# Patient Record
Sex: Female | Born: 1970 | Race: Black or African American | Hispanic: No | Marital: Single | Smoking: Never smoker
Health system: Southern US, Community
[De-identification: ages and names within clinical notes are randomized; demographics above are authoritative.]

## PROBLEM LIST (undated history)

## (undated) DIAGNOSIS — I10 Essential (primary) hypertension: Secondary | ICD-10-CM

## (undated) HISTORY — PX: ABDOMINAL HYSTERECTOMY: SHX81

## (undated) HISTORY — PX: REPLACEMENT TOTAL KNEE: SUR1224

---

## 2010-07-09 ENCOUNTER — Ambulatory Visit: Payer: Self-pay | Admitting: Obstetrics and Gynecology

## 2010-07-15 ENCOUNTER — Ambulatory Visit: Payer: Self-pay | Admitting: Obstetrics and Gynecology

## 2010-07-19 ENCOUNTER — Emergency Department: Payer: Self-pay | Admitting: Emergency Medicine

## 2011-06-15 ENCOUNTER — Emergency Department: Payer: Self-pay | Admitting: Emergency Medicine

## 2011-06-20 ENCOUNTER — Ambulatory Visit: Payer: Self-pay | Admitting: Physician Assistant

## 2012-06-10 ENCOUNTER — Emergency Department: Payer: Self-pay | Admitting: Emergency Medicine

## 2013-04-24 ENCOUNTER — Emergency Department: Payer: Self-pay | Admitting: Emergency Medicine

## 2014-04-24 ENCOUNTER — Emergency Department
Admit: 2014-04-24 | Disposition: A | Discharge status: Home / Self Care | Payer: Self-pay | Admitting: Emergency Medicine

## 2014-04-24 LAB — CBC WITH DIFFERENTIAL/PLATELET
BASOS PCT: 0.5 %
Basophil #: 0.1 10*3/uL (ref 0.0–0.1)
EOS ABS: 0.1 10*3/uL (ref 0.0–0.7)
Eosinophil %: 1.1 %
HCT: 42.2 % (ref 35.0–47.0)
HGB: 13.4 g/dL (ref 12.0–16.0)
Lymphocyte #: 1.2 10*3/uL (ref 1.0–3.6)
Lymphocyte %: 12.3 %
MCH: 27.1 pg (ref 26.0–34.0)
MCHC: 31.8 g/dL — ABNORMAL LOW (ref 32.0–36.0)
MCV: 85 fL (ref 80–100)
MONO ABS: 0.9 x10 3/mm (ref 0.2–0.9)
Monocyte %: 8.7 %
NEUTROS ABS: 7.8 10*3/uL — AB (ref 1.4–6.5)
Neutrophil %: 77.4 %
Platelet: 168 10*3/uL (ref 150–440)
RBC: 4.96 10*6/uL (ref 3.80–5.20)
RDW: 14.5 % (ref 11.5–14.5)
WBC: 10.1 10*3/uL (ref 3.6–11.0)

## 2014-04-25 LAB — BASIC METABOLIC PANEL
Anion Gap: 10 (ref 7–16)
BUN: 12 mg/dL
Calcium, Total: 8.8 mg/dL — ABNORMAL LOW
Chloride: 104 mmol/L
Co2: 25 mmol/L
Creatinine: 0.88 mg/dL
EGFR (Non-African Amer.): >60
GLUCOSE: 106 mg/dL — AB
POTASSIUM: 3.2 mmol/L — AB
Sodium: 139 mmol/L

## 2014-11-29 ENCOUNTER — Emergency Department: Payer: BLUE CROSS/BLUE SHIELD

## 2014-11-29 ENCOUNTER — Emergency Department
Admission: EM | Admit: 2014-11-29 | Discharge: 2014-11-29 | Disposition: A | Discharge status: Home / Self Care | Payer: BLUE CROSS/BLUE SHIELD | Attending: Emergency Medicine | Admitting: Emergency Medicine

## 2014-11-29 DIAGNOSIS — R059 Cough, unspecified: Secondary | ICD-10-CM

## 2014-11-29 DIAGNOSIS — J029 Acute pharyngitis, unspecified: Secondary | ICD-10-CM | POA: Insufficient documentation

## 2014-11-29 DIAGNOSIS — R05 Cough: Secondary | ICD-10-CM | POA: Diagnosis present

## 2014-11-29 DIAGNOSIS — J159 Unspecified bacterial pneumonia: Secondary | ICD-10-CM | POA: Insufficient documentation

## 2014-11-29 DIAGNOSIS — J189 Pneumonia, unspecified organism: Secondary | ICD-10-CM

## 2014-11-29 MED ORDER — BENZONATATE 100 MG PO CAPS: 100.0000 mg | ORAL_CAPSULE | Freq: Three times a day (TID) | ORAL | Status: DC | PRN

## 2014-11-29 MED ORDER — ALBUTEROL SULFATE HFA 108 (90 BASE) MCG/ACT IN AERS: 2.0000 | INHALATION_SPRAY | Freq: Four times a day (QID) | RESPIRATORY_TRACT | Status: DC | PRN

## 2014-11-29 MED ORDER — IPRATROPIUM-ALBUTEROL 0.5-2.5 (3) MG/3ML IN SOLN
3.0000 mL | Freq: Once | RESPIRATORY_TRACT | Status: AC
  Administered 2014-11-29: 3 mL via RESPIRATORY_TRACT
  Filled 2014-11-29: qty 3

## 2014-11-29 MED ORDER — AZITHROMYCIN 250 MG PO TABS: ORAL_TABLET | ORAL | Status: AC

## 2014-11-29 NOTE — ED Notes (Signed)
Assessment per PA 

## 2014-11-29 NOTE — ED Provider Notes (Signed)
[facility] Emergency Department Provider Note ____________________________________________  Time seen: 1805  I have reviewed the triage vital signs and the nursing notes.  HISTORY  Chief Complaint  Cough  HPI Stacey Bentley is a 44 y.o. female reports to the ED for evaluation of intermittent productive cough for the last 3 weeks. She reports as well or stiffness to her voice and mild sore throat. She describes cough is worse at night. She has had a few episodes where she has been coughing up sputum, so the point of vomiting. She denies any outright chest pain, shortness of breath, or fevers/chills/sweats. She's been dosing over-the-counter medicine without significant relief, but has recently discontinued them due to concerns for elevated blood pressure.  History reviewed. No pertinent past medical history.  There are no active problems to display for this patient.  History reviewed. No pertinent past surgical history.  Current Outpatient Rx  Name  Route  Sig  Dispense  Refill  . albuterol (PROVENTIL HFA;VENTOLIN HFA) 108 (90 BASE) MCG/ACT inhaler   Inhalation   Inhale 2 puffs into the lungs every 6 (six) hours as needed for wheezing or shortness of breath.   1 Inhaler   0   . azithromycin (ZITHROMAX Z-PAK) 250 MG tablet      Take 2 tablets (500 mg) on  Day 1,  followed by 1 tablet (250 mg) once daily on Days 2 through 5.   6 each   0   . benzonatate (TESSALON PERLES) 100 MG capsule   Oral   Take 1 capsule (100 mg total) by mouth 3 (three) times daily as needed for cough (Take 1-2 per dose).   30 capsule   0     Allergies Review of patient's allergies indicates no known allergies.  History reviewed. No pertinent family history.  Social History Social History  Substance Use Topics  . Smoking status: Never Smoker   . Smokeless tobacco: None  . Alcohol Use: No   Review of Systems  Constitutional: Negative for fever. Eyes: Negative for  visual changes. ENT: Negative for sore throat. Cardiovascular: Negative for chest pain. Respiratory: Negative for shortness of breath. Productive cough as above. Gastrointestinal: Negative for abdominal pain, vomiting and diarrhea. Genitourinary: Negative for dysuria. Musculoskeletal: Negative for back pain. Skin: Negative for rash. Neurological: Negative for headaches, focal weakness or numbness. ____________________________________________  PHYSICAL EXAM:  VITAL SIGNS: ED Triage Vitals  Enc Vitals Group     BP 11/29/14 1747 142/81 mmHg     Pulse Rate 11/29/14 1747 61     Resp 11/29/14 1747 18     Temp 11/29/14 1747 98.2 F (36.8 C)     Temp Source 11/29/14 1747 Oral     SpO2 11/29/14 1747 100 %     Weight 11/29/14 1747 260 lb (117.935 kg)     Height 11/29/14 1747  (1.803 m)     Head Cir --      Peak Flow --      Pain Score --      Pain Loc --      Pain Edu? --      Excl. in GC? --    Constitutional: Alert and oriented. Well appearing and in no distress. Head: Normocephalic and atraumatic.      Eyes: Conjunctivae are normal. PERRL. Normal extraocular movements      Ears: Canals clear. TMs intact bilaterally.   Nose: No congestion/rhinorrhea.   Mouth/Throat: Mucous membranes are moist.   Neck: Supple.  No thyromegaly. Hematological/Lymphatic/Immunological: No cervical lymphadenopathy. Cardiovascular: Normal rate, regular rhythm.  Respiratory: Normal respiratory effort. No wheezes/rales/rhonchi. Gastrointestinal: Soft and nontender. No distention. Musculoskeletal: Nontender with normal range of motion in all extremities.  Neurologic:  Normal gait without ataxia. Normal speech and language. No gross focal neurologic deficits are appreciated. Skin:  Skin is warm, dry and intact. No rash noted. Psychiatric: Mood and affect are normal. Patient exhibits appropriate insight and judgment. ____________________________________________    RADIOLOGY CXR IMPRESSION: Mild patchy opacity along the medial right lung base, atelectasis versus pneumonia.  I, Trinidi Toppins, Charlesetta IvoryJenise V Bacon, personally viewed and evaluated these images (plain radiographs) as part of my medical decision making.  ____________________________________________  PROCEDURES  Duoneb x 1 ____________________________________________  INITIAL IMPRESSION / ASSESSMENT AND PLAN / ED COURSE  Patient with radiologic findings suspicious for a right middle lobe pneumonia versus atelectasis. Given patient's ongoing symptoms and productive cough, we will treat empirically for presumed community acquired pneumonia. She will be provided with a prescription for Z-Pak, albuterol, and Tessalon Perles to dose as directed. She is encouraged to increase her fluid intake to reduce dehydration and promote productive cough. She is follow with a primary care provider or return to the ED for acutely worsening symptoms. ____________________________________________  FINAL CLINICAL IMPRESSION(S) / ED DIAGNOSES  Final diagnoses:  Community acquired pneumonia  Cough      Lissa HoardJenise V Bacon Masey Scheiber, PA-C 11/29/14 1858  Sharyn CreamerMark Quale, MD 11/29/14 [phone]

## 2014-11-29 NOTE — Discharge Instructions (Signed)
Community-Acquired Pneumonia, Adult Pneumonia is an infection of the lungs. One type of pneumonia can happen while a person is in a hospital. A different type can happen when a person is not in a hospital (community-acquired pneumonia). It is easy for this kind to spread from person to person. It can spread to you if you breathe near an infected person who coughs or sneezes. Some symptoms include:  A dry cough.  A wet (productive) cough.  Fever.  Sweating.  Chest pain. HOME CARE  Take over-the-counter and prescription medicines only as told by your doctor.  Only take cough medicine if you are losing sleep.  If you were prescribed an antibiotic medicine, take it as told by your doctor. Do not stop taking the antibiotic even if you start to feel better.  Sleep with your head and neck raised (elevated). You can do this by putting a few pillows under your head, or you can sleep in a recliner.  Do not use tobacco products. These include cigarettes, chewing tobacco, and e-cigarettes. If you need help quitting, ask your doctor.  Drink enough water to keep your pee (urine) clear or pale yellow. A shot (vaccine) can help prevent pneumonia. Shots are often suggested for:  People older than 10265 years of age.  People older than 46 years of age:  Who are having cancer treatment.  Who have long-term (chronic) lung disease.  Who have problems with their body's defense system (immune system). You may also prevent pneumonia if you take these actions:  Get the flu (influenza) shot every year.  Go to the dentist as often as told.  Wash your hands often. If soap and water are not available, use hand sanitizer. GET HELP IF:  You have a fever.  You lose sleep because your cough medicine does not help. GET HELP RIGHT AWAY IF:  You are short of breath and it gets worse.  You have more chest pain.  Your sickness gets worse. This is very serious if:  You are an older adult.  Your  body's defense system is weak.  You cough up blood.   This information is not intended to replace advice given to you by your health care provider. Make sure you discuss any questions you have with your health care provider.   Document Released: 06/25/2007 Document Revised: 09/27/2014 Document Reviewed: 05/03/2014 Elsevier Interactive Patient Education 2016 [employer]  Cough, Adult A cough helps to clear your throat and lungs. A cough may last only 2-3 weeks (acute), or it may last longer than 8 weeks (chronic). Many different things can cause a cough. A cough may be a sign of an illness or another medical condition. HOME CARE  Pay attention to any changes in your cough.  Take medicines only as told by your doctor.  If you were prescribed an antibiotic medicine, take it as told by your doctor. Do not stop taking it even if you start to feel better.  Talk with your doctor before you try using a cough medicine.  Drink enough fluid to keep your pee (urine) clear or pale yellow.  If the air is dry, use a cold steam vaporizer or humidifier in your home.  Stay away from things that make you cough at work or at home.  If your cough is worse at night, try using extra pillows to raise your head up higher while you sleep.  Do not smoke, and try not to be around smoke. If you need help quitting, ask  your doctor.  Do not have caffeine.  Do not drink alcohol.  Rest as needed. GET HELP IF:  You have new problems (symptoms).  You cough up yellow fluid (pus).  Your cough does not get better after 2-3 weeks, or your cough gets worse.  Medicine does not help your cough and you are not sleeping well.  You have pain that gets worse or pain that is not helped with medicine.  You have a fever.  You are losing weight and you do not know why.  You have night sweats. GET HELP RIGHT AWAY IF:  You cough up blood.  You have trouble breathing.  Your heartbeat is very fast.   This  information is not intended to replace advice given to you by your health care provider. Make sure you discuss any questions you have with your health care provider.   Document Released: 09/19/2010 Document Revised: 09/27/2014 Document Reviewed: 03/15/2014 Elsevier Interactive Patient Education [employer]  Take the antibiotic as directed until completely gone. Follow-up with [facility] as needed for ongoing symptoms. Return as needed for worsening symptoms.

## 2014-11-29 NOTE — ED Notes (Signed)
Patient with cough for 3 weeks.

## 2015-03-10 ENCOUNTER — Emergency Department: Payer: BLUE CROSS/BLUE SHIELD

## 2015-03-10 ENCOUNTER — Encounter: Payer: Self-pay | Admitting: Emergency Medicine

## 2015-03-10 ENCOUNTER — Emergency Department
Admission: EM | Admit: 2015-03-10 | Discharge: 2015-03-10 | Disposition: A | Discharge status: Home / Self Care | Payer: BLUE CROSS/BLUE SHIELD | Attending: Emergency Medicine | Admitting: Emergency Medicine

## 2015-03-10 DIAGNOSIS — R0601 Orthopnea: Secondary | ICD-10-CM | POA: Insufficient documentation

## 2015-03-10 DIAGNOSIS — M1711 Unilateral primary osteoarthritis, right knee: Secondary | ICD-10-CM | POA: Diagnosis not present

## 2015-03-10 DIAGNOSIS — R030 Elevated blood-pressure reading, without diagnosis of hypertension: Secondary | ICD-10-CM | POA: Diagnosis not present

## 2015-03-10 DIAGNOSIS — R05 Cough: Secondary | ICD-10-CM | POA: Insufficient documentation

## 2015-03-10 DIAGNOSIS — I517 Cardiomegaly: Secondary | ICD-10-CM | POA: Diagnosis not present

## 2015-03-10 DIAGNOSIS — M25561 Pain in right knee: Secondary | ICD-10-CM | POA: Diagnosis present

## 2015-03-10 LAB — BASIC METABOLIC PANEL
Anion gap: 5 (ref 5–15)
BUN: 11 mg/dL (ref 6–20)
CALCIUM: 9 mg/dL (ref 8.9–10.3)
CO2: 30 mmol/L (ref 22–32)
CREATININE: 0.74 mg/dL (ref 0.44–1.00)
Chloride: 104 mmol/L (ref 101–111)
GFR calc Af Amer: >60 mL/min (ref >60)
GLUCOSE: 91 mg/dL (ref 65–99)
Potassium: 3.7 mmol/L (ref 3.5–5.1)
Sodium: 139 mmol/L (ref 135–145)

## 2015-03-10 LAB — TROPONIN I

## 2015-03-10 LAB — BRAIN NATRIURETIC PEPTIDE: B NATRIURETIC PEPTIDE 5: 59 pg/mL (ref 0.0–100.0)

## 2015-03-10 NOTE — ED Notes (Signed)
Reports cough and sob when lying flat x 3 weeks.  No resp distress.  Also reports right knee pain, states she twisted it.

## 2015-03-10 NOTE — ED Provider Notes (Signed)
[facility] Emergency Department Provider Note  ____________________________________________  Time seen: Approximately 4:19 PM  I have reviewed the triage vital signs and the nursing notes.   HISTORY  Chief Complaint Cough and Knee Pain    HPI Stacey Bentley is a 45 y.o. female , NAD, reports the emergency department with cough and shortness of breath when lying flat for 3 weeks. They said began while she was in the shower and felt chest tightness and fatigue. He states she lost balance and braced herself with her right leg injury her right knee. Denies any fever, chills, body aches. Has not had any chest pain since that time. Denies shortness of breath or wheeze other than when she lies flat. Not had any symptoms of paroxysmal nocturnal dyspnea. Denies any personal past medical history. Denies any familial history of cardiac or pulmonary issues. Does not have a primary care provider. Does report that she had surgery to her right knee approximately 3-4 months ago.   History reviewed. No pertinent past medical history.  There are no active problems to display for this patient.   History reviewed. No pertinent past surgical history.  Current Outpatient Rx  Name  Route  Sig  Dispense  Refill  . albuterol (PROVENTIL HFA;VENTOLIN HFA) 108 (90 BASE) MCG/ACT inhaler   Inhalation   Inhale 2 puffs into the lungs every 6 (six) hours as needed for wheezing or shortness of breath.   1 Inhaler   0   . azithromycin (ZITHROMAX Z-PAK) 250 MG tablet      Take 2 tablets (500 mg) on  Day 1,  followed by 1 tablet (250 mg) once daily on Days 2 through 5.   6 each   0   . benzonatate (TESSALON PERLES) 100 MG capsule   Oral   Take 1 capsule (100 mg total) by mouth 3 (three) times daily as needed for cough (Take 1-2 per dose).   30 capsule   0     Allergies Review of patient's allergies indicates no known allergies.  No family history on file.  Social  History Social History  Substance Use Topics  . Smoking status: Never Smoker   . Smokeless tobacco: None  . Alcohol Use: No     Review of Systems  Constitutional: No fever/chills or fatigue Eyes: No visual changes.  ENT: No sore throat, ear pain, nasal congestion. Cardiovascular: Positive chest pain. Respiratory: Positive cough, shortness of breath. No wheezing.  Gastrointestinal: No abdominal pain.  No nausea, vomiting.   Musculoskeletal: Positive right knee pain. Negative for back pain.  Skin: Negative for rash. Neurological: Negative for headaches, focal weakness or numbness. 10-point ROS otherwise negative.  ____________________________________________   PHYSICAL EXAM:  VITAL SIGNS: ED Triage Vitals  Enc Vitals Group     BP 03/10/15 1518 125/100 mmHg     Pulse Rate 03/10/15 1518 67     Resp 03/10/15 1518 18     Temp 03/10/15 1518 98.6 F (37 C)     Temp Source 03/10/15 1518 Oral     SpO2 03/10/15 1518 98 %     Weight 03/10/15 1518 276 lb (125.193 kg)     Height 03/10/15 1518  (1.803 m)     Head Cir --      Peak Flow --      Pain Score --      Pain Loc --      Pain Edu? --      Excl. in GC? --  Constitutional: Alert and oriented. Well appearing and in no acute distress. Obese Eyes: Conjunctivae are normal. PERRL. EOMI without pain.  Head: Atraumatic. Neck: No stridor. Supple with full range of motion. Hematological/Lymphatic/Immunilogical: No cervical lymphadenopathy. Cardiovascular: Normal rate, regular rhythm. Grossly normal S1 and S2.  No murmurs, rubs, or gallops. Good peripheral circulation. Respiratory: Normal respiratory effort without tachypnea or retractions. Lungs CTAB. Musculoskeletal: No tenderness to palpation about the right knee. No effusion. Full range of motion of the right knee. No edema.  No laxity of the right knee with anterior posterior drawer. No medial or lateral tenderness to palpation. Neurologic:  Normal speech and language.  No gross focal neurologic deficits are appreciated.  Skin:  Skin is warm, dry and intact. No rash noted. Psychiatric: Mood and affect are normal. Speech and behavior are normal. Patient exhibits appropriate insight and judgement.   ____________________________________________   LABS (all labs ordered are listed, but only abnormal results are displayed)  Labs Reviewed  TROPONIN I  BRAIN NATRIURETIC PEPTIDE  BASIC METABOLIC PANEL   ____________________________________________  EKG  EKG showed sinus rhythm and no acute changes. EKG also reviewed and discussed with Dr. Jene Every. ____________________________________________  RADIOLOGY I have personally viewed and evaluated these images (plain radiographs) as part of my medical decision making, as well as reviewing the written report by the radiologist.  Dg Chest 2 View  03/10/2015  CLINICAL DATA:  Shortness of breath for 3 weeks.  Initial encounter. EXAM: CHEST  2 VIEW COMPARISON:  PA and lateral chest 11/29/2014 and 04/24/2013. FINDINGS: The lungs are clear. Heart size is mildly enlarged. No pneumothorax or pleural effusion. No focal bony abnormality. IMPRESSION: Mild cardiomegaly without acute disease. Electronically Signed   By: Drusilla Kanner M.D.   On: 03/10/2015 16:40   Dg Knee Complete 4 Views Right  03/10/2015  CLINICAL DATA:  Right knee pain for 3-4 weeks. No known injury. Initial encounter. History of prior right knee surgery in 2016. EXAM: RIGHT KNEE - COMPLETE 4+ VIEW COMPARISON:  None. FINDINGS: No acute bony or joint abnormality is identified. The patient has markedly advanced for age and severe degenerative disease about the knee appearing worst medially where there is bone-on-bone joint space narrowing. Bulky tricompartmental osteophytosis is seen. There is a small joint effusion. IMPRESSION: No acute abnormality. 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 Electronically Signed   By: Drusilla Kanner M.D.   On: 03/10/2015 16:56    ____________________________________________    PROCEDURES  Procedure(s) performed: None    Medications - No data to display   ____________________________________________   INITIAL IMPRESSION / ASSESSMENT AND PLAN / ED COURSE  Pertinent labs & imaging results that were available during my care of the patient were reviewed by me and considered in my medical decision making (see chart for details).  Patient has been stable without any significant symptoms throughout her ED course. Patient's presentation and care while in the ED has been discussed with Dr. Jene Every who agrees with treatment course. All pertinent labs returned without any significant abnormalities. Troponin 1 was negative. At this time the patient's diagnosis is consistent with orthopnea with cardiomegaly on chest x-ray. Symptoms have been ongoing chronically may be related to untreated hypertension. Advise that the patient follow up with Dr. Juliann Pares with [facility] cardiology. Also advised the patient establish care with [facility] clinic for primary care. Patient may take Tylenol as needed for arthritic pain in the right knee. Advise that she follow-up with her personal orthopedic  in follow-up if her pain continues. Patient is given ED precautions to return to the ED for any worsening or new symptoms.    ____________________________________________  FINAL CLINICAL IMPRESSION(S) / ED DIAGNOSES  Final diagnoses:  Orthopnea  Cardiomegaly  Elevated blood-pressure reading without diagnosis of hypertension  Arthritis of right knee      NEW MEDICATIONS STARTED DURING THIS VISIT:  New Prescriptions   No medications on file         Hope Pigeon, PA-C 03/10/15 1843  Jene Every, MD 03/10/15 2358

## 2015-03-10 NOTE — ED Provider Notes (Signed)
ED ECG REPORT I, Jene Every, the attending physician, personally viewed and interpreted this ECG.  Date: 03/10/2015 EKG Time: 5:35 PM Rate: 61 Rhythm: normal sinus rhythm QRS Axis: normal Intervals: normal ST/T Wave abnormalities: normal Conduction Disturbances: none Narrative Interpretation: unremarkable   Jene Every, MD 03/10/15 2352

## 2015-03-10 NOTE — ED Notes (Signed)
NAD noted at time of D/C. Pt denies questions or concerns. Pt ambulatory to the lobby at this time.  

## 2015-03-10 NOTE — Discharge Instructions (Signed)
Arthritis Arthritis is a term that is commonly used to refer to joint pain or joint disease. There are more than 100 types of arthritis. CAUSES The most common cause of this condition is wear and tear of a joint. Other causes include:  Gout.  Inflammation of a joint.  An infection of a joint.  Sprains and other injuries near the joint.  A drug reaction or allergic reaction. In some cases, the cause may not be known. SYMPTOMS The main symptom of this condition is pain in the joint with movement. Other symptoms include:  Redness, swelling, or stiffness at a joint.  Warmth coming from the joint.  Fever.  Overall feeling of illness. DIAGNOSIS This condition may be diagnosed with a physical exam and tests, including:  Blood tests.  Urine tests.  Imaging tests, such as MRI, X-rays, or a CT scan. Sometimes, fluid is removed from a joint for testing. TREATMENT Treatment for this condition may involve:  Treatment of the cause, if it is known.  Rest.  Raising (elevating) the joint.  Applying cold or hot packs to the joint.  Medicines to improve symptoms and reduce inflammation.  Injections of a steroid such as cortisone into the joint to help reduce pain and inflammation. Depending on the cause of your arthritis, you may need to make lifestyle changes to reduce stress on your joint. These changes may include exercising more and losing weight. HOME CARE INSTRUCTIONS Medicines  Take over-the-counter and prescription medicines only as told by your health care provider.  Do not take aspirin to relieve pain if gout is suspected. Activities  Rest your joint if told by your health care provider. Rest is important when your disease is active and your joint feels painful, swollen, or stiff.  Avoid activities that make the pain worse. It is important to balance activity with rest.  Exercise your joint regularly with range-of-motion exercises as told by your health care  provider. Try doing low-impact exercise, such as:  Swimming.  Water aerobics.  Biking.  Walking. Joint Care  If your joint is swollen, keep it elevated if told by your health care provider.  If your joint feels stiff in the morning, try taking a warm shower.  If directed, apply heat to the joint. If you have diabetes, do not apply heat without permission from your health care provider.  Put a towel between the joint and the hot pack or heating pad.  Leave the heat on the area for 20-30 minutes.  If directed, apply ice to the joint:  Put ice in a plastic bag.  Place a towel between your skin and the bag.  Leave the ice on for 20 minutes, 2-3 times per day.  Keep all follow-up visits as told by your health care provider. This is important. SEEK MEDICAL CARE IF:  The pain gets worse.  You have a fever. SEEK IMMEDIATE MEDICAL CARE IF:  You develop severe joint pain, swelling, or redness.  Many joints become painful and swollen.  You develop severe back pain.  You develop severe weakness in your leg.  You cannot control your bladder or bowels.   This information is not intended to replace advice given to you by your health care provider. Make sure you discuss any questions you have with your health care provider.   Document Released: 02/14/2004 Document Revised: 09/27/2014 Document Reviewed: 04/03/2014 Elsevier Interactive Patient Education 2016 [employer] Inc[employer]  Blood Pressure Record Sheet Your blood pressure on this visit to the emergency department  or clinic is elevated. This does not necessarily mean you have high blood pressure (hypertension), but it does mean that your blood pressure needs to be rechecked. Many times your blood pressure can increase due to illness, pain, anxiety, or other factors. We recommend that you get a series of blood pressure readings done over a period of 5 days. It is best to get a reading in the morning and one in the evening. You  should make sure to sit and relax for 1-5 minutes before the reading is taken. Write the readings down and make a follow-up appointment with your health care provider to discuss the results. If there is not a free clinic or a drug store with a blood-pressure-taking machine near you, you can purchase blood-pressure-taking equipment from a drug store. Having one in the home allows you the convenience of taking your blood pressure while you are home and relaxed.  Your blood pressure in the emergency department or clinic on ________ was ____________________. BLOOD PRESSURE LOG Date: _______________________  a.m. _____________________  p.m. _____________________ Date: _______________________  a.m. _____________________  p.m. _____________________ Date: _______________________  a.m. _____________________  p.m. _____________________ Date: _______________________  a.m. _____________________  p.m. _____________________ Date: _______________________  a.m. _____________________  p.m. _____________________   This information is not intended to replace advice given to you by your health care provider. Make sure you discuss any questions you have with your health care provider.   Document Released: 10/05/2002 Document Revised: 01/27/2014 Document Reviewed: 03/01/2013 Elsevier Interactive Patient Education 2016 [employer].  How to Take Your Blood Pressure HOW DO I GET A BLOOD PRESSURE MACHINE?  You can buy an electronic home blood pressure machine at your local pharmacy. Insurance will sometimes cover the cost if you have a prescription.  Ask your doctor what type of machine is best for you. There are different machines for your arm and your wrist.  If you decide to buy a machine to check your blood pressure on your arm, first check the size of your arm so you can buy the right size cuff. To check the size of your arm:   Use a measuring tape that shows both inches and centimeters.    Wrap the measuring tape around the upper-middle part of your arm. You may need someone to help you measure.   Write down your arm measurement in both inches and centimeters.   To measure your blood pressure correctly, it is important to have the right size cuff.   If your arm is up to 13 inches (up to 34 centimeters), get an adult cuff size.  If your arm is 13 to 17 inches (35 to 44 centimeters), get a large adult cuff size.    If your arm is 17 to 20 inches (45 to 52 centimeters), get an adult thigh cuff.  WHAT DO THE NUMBERS MEAN?   There are two numbers that make up your blood pressure. For example: 120/80.  The first number (120 in our example) is called the "systolic pressure." It is a measure of the pressure in your blood vessels when your heart is pumping blood.  The second number (80 in our example) is called the "diastolic pressure." It is a measure of the pressure in your blood vessels when your heart is resting between beats.  Your doctor will tell you what your blood pressure should be. WHAT SHOULD I DO BEFORE I CHECK MY BLOOD PRESSURE?   Try to rest or relax for at least 30 minutes before  you check your blood pressure.  Do not smoke.  Do not have any drinks with caffeine, such as:  Soda.  Coffee.  Tea.  Check your blood pressure in a quiet room.  Sit down and stretch out your arm on a table. Keep your arm at about the level of your heart. Let your arm relax.  Make sure that your legs are not crossed. HOW DO I CHECK MY BLOOD PRESSURE?  Follow the directions that came with your machine.  Make sure you remove any tight-fitting clothing from your arm or wrist. Wrap the cuff around your upper arm or wrist. You should be able to fit a finger between the cuff and your arm. If you cannot fit a finger between the cuff and your arm, it is too tight and should be removed and rewrapped.  Some units require you to manually pump up the arm cuff.  Automatic  units inflate the cuff when you press a button.  Cuff deflation is automatic in both models.  After the cuff is inflated, the unit measures your blood pressure and pulse. The readings are shown on a monitor. Hold still and breathe normally while the cuff is inflated.  Getting a reading takes less than a minute.  Some models store readings in a memory. Some provide a printout of readings. If your machine does not store your readings, keep a written record.  Take readings with you to your next visit with your doctor.   This information is not intended to replace advice given to you by your health care provider. Make sure you discuss any questions you have with your health care provider.   Document Released: 12/20/2007 Document Revised: 01/27/2014 Document Reviewed: 03/03/2013 Elsevier Interactive Patient Education 2016 [employer].   Hypertension Hypertension is another name for high blood pressure. High blood pressure forces your heart to work harder to pump blood. A blood pressure reading has two numbers, which includes a higher number over a lower number (example: 110/72). HOME CARE   Have your blood pressure rechecked by your doctor.  Only take medicine as told by your doctor. Follow the directions carefully. The medicine does not work as well if you skip doses. Skipping doses also puts you at risk for problems.  Do not smoke.  Monitor your blood pressure at home as told by your doctor. GET HELP IF:  You think you are having a reaction to the medicine you are taking.  You have repeat headaches or feel dizzy.  You have puffiness (swelling) in your ankles.  You have trouble with your vision. GET HELP RIGHT AWAY IF:   You get a very bad headache and are confused.  You feel weak, numb, or faint.  You get chest or belly (abdominal) pain.  You throw up (vomit).  You cannot breathe very well. MAKE SURE YOU:   Understand these instructions.  Will watch your  condition.  Will get help right away if you are not doing well or get worse.   This information is not intended to replace advice given to you by your health care provider. Make sure you discuss any questions you have with your health care provider.   Document Released: 06/25/2007 Document Revised: 01/11/2013 Document Reviewed: 10/29/2012 Elsevier Interactive Patient Education 2016 [employer].  Shortness of Breath Shortness of breath means you have trouble breathing. Shortness of breath needs medical care right away. HOME CARE   Do not smoke.  Avoid being around chemicals or things (paint fumes, dust)  that may bother your breathing.  Rest as needed. Slowly begin your normal activities.  Only take medicines as told by your doctor.  Keep all doctor visits as told. GET HELP RIGHT AWAY IF:   Your shortness of breath gets worse.  You feel lightheaded, pass out (faint), or have a cough that is not helped by medicine.  You cough up blood.  You have pain with breathing.  You have pain in your chest, arms, shoulders, or belly (abdomen).  You have a fever.  You cannot walk up stairs or exercise the way you normally do.  You do not get better in the time expected.  You have a hard time doing normal activities even with rest.  You have problems with your medicines.  You have any new symptoms. MAKE SURE YOU:  Understand these instructions.  Will watch your condition.  Will get help right away if you are not doing well or get worse.   This information is not intended to replace advice given to you by your health care provider. Make sure you discuss any questions you have with your health care provider.   Document Released: 06/25/2007 Document Revised: 01/11/2013 Document Reviewed: 03/24/2011 Elsevier Interactive Patient Education [employer]

## 2015-04-12 ENCOUNTER — Ambulatory Visit: Payer: BLUE CROSS/BLUE SHIELD | Attending: Internal Medicine

## 2015-04-12 DIAGNOSIS — G4733 Obstructive sleep apnea (adult) (pediatric): Secondary | ICD-10-CM | POA: Insufficient documentation

## 2015-04-12 DIAGNOSIS — R0683 Snoring: Secondary | ICD-10-CM | POA: Diagnosis present

## 2015-04-12 DIAGNOSIS — G4710 Hypersomnia, unspecified: Secondary | ICD-10-CM | POA: Diagnosis present

## 2015-05-25 ENCOUNTER — Other Ambulatory Visit: Payer: Self-pay | Admitting: Unknown Physician Specialty

## 2015-05-25 DIAGNOSIS — M1711 Unilateral primary osteoarthritis, right knee: Secondary | ICD-10-CM

## 2015-07-19 ENCOUNTER — Ambulatory Visit
Admission: RE | Admit: 2015-07-19 | Discharge: 2015-07-19 | Disposition: A | Discharge status: Home / Self Care | Payer: BLUE CROSS/BLUE SHIELD | Source: Ambulatory Visit | Attending: Unknown Physician Specialty | Admitting: Unknown Physician Specialty

## 2015-07-19 DIAGNOSIS — M25461 Effusion, right knee: Secondary | ICD-10-CM | POA: Diagnosis not present

## 2015-07-19 DIAGNOSIS — Z01818 Encounter for other preprocedural examination: Secondary | ICD-10-CM | POA: Insufficient documentation

## 2015-07-19 DIAGNOSIS — M1711 Unilateral primary osteoarthritis, right knee: Secondary | ICD-10-CM | POA: Diagnosis present

## 2016-11-06 ENCOUNTER — Emergency Department: Payer: BLUE CROSS/BLUE SHIELD

## 2016-11-06 ENCOUNTER — Encounter: Payer: Self-pay | Admitting: Emergency Medicine

## 2016-11-06 DIAGNOSIS — M25461 Effusion, right knee: Secondary | ICD-10-CM | POA: Diagnosis not present

## 2016-11-06 DIAGNOSIS — S8001XA Contusion of right knee, initial encounter: Secondary | ICD-10-CM | POA: Insufficient documentation

## 2016-11-06 DIAGNOSIS — S39012A Strain of muscle, fascia and tendon of lower back, initial encounter: Secondary | ICD-10-CM | POA: Insufficient documentation

## 2016-11-06 DIAGNOSIS — S161XXA Strain of muscle, fascia and tendon at neck level, initial encounter: Secondary | ICD-10-CM | POA: Diagnosis not present

## 2016-11-06 DIAGNOSIS — Y9389 Activity, other specified: Secondary | ICD-10-CM | POA: Diagnosis not present

## 2016-11-06 DIAGNOSIS — Z96652 Presence of left artificial knee joint: Secondary | ICD-10-CM | POA: Insufficient documentation

## 2016-11-06 DIAGNOSIS — S3992XA Unspecified injury of lower back, initial encounter: Secondary | ICD-10-CM | POA: Diagnosis present

## 2016-11-06 DIAGNOSIS — M25511 Pain in right shoulder: Secondary | ICD-10-CM | POA: Insufficient documentation

## 2016-11-06 DIAGNOSIS — Y92410 Unspecified street and highway as the place of occurrence of the external cause: Secondary | ICD-10-CM | POA: Diagnosis not present

## 2016-11-06 DIAGNOSIS — Y998 Other external cause status: Secondary | ICD-10-CM | POA: Diagnosis not present
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
## 2016-11-07 ENCOUNTER — Emergency Department
Admission: EM | Admit: 2016-11-07 | Discharge: 2016-11-07 | Disposition: A | Discharge status: Home / Self Care | Payer: BLUE CROSS/BLUE SHIELD | Attending: Emergency Medicine | Admitting: Emergency Medicine

## 2016-11-07 DIAGNOSIS — S161XXA Strain of muscle, fascia and tendon at neck level, initial encounter: Secondary | ICD-10-CM

## 2016-11-07 DIAGNOSIS — M25511 Pain in right shoulder: Secondary | ICD-10-CM

## 2016-11-07 DIAGNOSIS — S8001XA Contusion of right knee, initial encounter: Secondary | ICD-10-CM

## 2016-11-07 DIAGNOSIS — M25461 Effusion, right knee: Secondary | ICD-10-CM

## 2016-11-07 DIAGNOSIS — S39012A Strain of muscle, fascia and tendon of lower back, initial encounter: Secondary | ICD-10-CM

## 2016-11-07 MED ORDER — CYCLOBENZAPRINE HCL 5 MG PO TABS: ORAL_TABLET | ORAL | 0 refills | Status: AC

## 2016-11-07 MED ORDER — CYCLOBENZAPRINE HCL 10 MG PO TABS
5.0000 mg | ORAL_TABLET | Freq: Once | ORAL | Status: AC
  Administered 2016-11-07: 5 mg via ORAL
  Filled 2016-11-07: qty 1

## 2016-11-07 MED ORDER — IBUPROFEN 800 MG PO TABS: 800.0000 mg | ORAL_TABLET | Freq: Three times a day (TID) | ORAL | 0 refills | Status: AC | PRN

## 2016-11-07 MED ORDER — KETOROLAC TROMETHAMINE 30 MG/ML IJ SOLN
60.0000 mg | Freq: Once | INTRAMUSCULAR | Status: AC
  Administered 2016-11-07: 60 mg via INTRAMUSCULAR
  Filled 2016-11-07: qty 2

## 2016-11-07 MED ORDER — HYDROCODONE-ACETAMINOPHEN 5-325 MG PO TABS: 1.0000 | ORAL_TABLET | Freq: Four times a day (QID) | ORAL | 0 refills | Status: DC | PRN

## 2016-11-07 NOTE — Discharge Instructions (Signed)
1. Take medicines as needed for pain and muscle spasms (Motrin/Norco/Flexeril #15). 2. Wear knee wrap and shoulder sling as needed for comfort. 3. Use your walker for the next few days to help you balance while walking. 4. Elevate affected area and apply ice several times daily to reduce swelling. 5. Return to the ER for worsening symptoms, persistent vomiting, difficulty breathing or other concerns.
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
## 2017-05-23 ENCOUNTER — Other Ambulatory Visit: Payer: Self-pay

## 2017-05-23 ENCOUNTER — Emergency Department: Payer: BLUE CROSS/BLUE SHIELD

## 2017-05-23 ENCOUNTER — Emergency Department
Admission: EM | Admit: 2017-05-23 | Discharge: 2017-05-23 | Disposition: A | Discharge status: Home / Self Care | Payer: BLUE CROSS/BLUE SHIELD | Attending: Emergency Medicine | Admitting: Emergency Medicine

## 2017-05-23 ENCOUNTER — Encounter: Payer: Self-pay | Admitting: Emergency Medicine

## 2017-05-23 DIAGNOSIS — J209 Acute bronchitis, unspecified: Secondary | ICD-10-CM | POA: Diagnosis not present

## 2017-05-23 DIAGNOSIS — I10 Essential (primary) hypertension: Secondary | ICD-10-CM | POA: Insufficient documentation

## 2017-05-23 DIAGNOSIS — R05 Cough: Secondary | ICD-10-CM | POA: Diagnosis present

## 2017-05-23 MED ORDER — PREDNISONE 10 MG PO TABS: 50.0000 mg | ORAL_TABLET | Freq: Every day | ORAL | 0 refills | Status: DC

## 2017-05-23 MED ORDER — ALBUTEROL SULFATE 108 (90 BASE) MCG/ACT IN AEPB: 2.0000 | INHALATION_SPRAY | RESPIRATORY_TRACT | 0 refills | Status: AC | PRN

## 2017-05-23 MED ORDER — GUAIFENESIN-CODEINE 100-10 MG/5ML PO SOLN: 10.0000 mL | Freq: Three times a day (TID) | ORAL | 0 refills | Status: DC | PRN

## 2017-05-23 MED ORDER — ALBUTEROL SULFATE (2.5 MG/3ML) 0.083% IN NEBU
2.5000 mg | INHALATION_SOLUTION | Freq: Once | RESPIRATORY_TRACT | Status: AC
  Administered 2017-05-23: 2.5 mg via RESPIRATORY_TRACT
  Filled 2017-05-23: qty 3

## 2017-05-23 NOTE — ED Notes (Signed)
Pt c/o having a tight chest and productive cough x2 weeks. Pt also reports losing her voice during the past few weeks. Pt denies having any respiratory issues for about 2 years now.

## 2017-05-23 NOTE — ED Triage Notes (Signed)
Pt presents with sore throat, cough, emesis x 2 weeks. Pt states it has gotten better but then is worse again. Pt alert & oriented with NAD noted.

## 2017-05-23 NOTE — ED Provider Notes (Signed)
[facility] Emergency Department Provider Note  ____________________________________________  Time seen: Approximately 6:06 PM  I have reviewed the triage vital signs and the nursing notes.   HISTORY  Chief Complaint Cough; Emesis; and Sore Throat   HPI Stacey Bentley is a 47 y.o. female who presents to the emergency department for evaluation and treatment of cough x 2 weeks. She reports coughing up thick mucous. She has also had a sore throat, but that is improving with gargling warm salt water. She has not taken any medications for these symptoms. She states that in the past, she was given an inhaler that provided relief of similar symptoms but doesn't have it anymore.   History reviewed. No pertinent past medical history.  There are no active problems to display for this patient.   Past Surgical History:  Procedure Laterality Date  . ABDOMINAL HYSTERECTOMY    . REPLACEMENT TOTAL KNEE Right     Prior to Admission medications   Medication Sig Start Date End Date Taking? Authorizing Provider  Albuterol Sulfate (PROAIR RESPICLICK) 108 (90 Base) MCG/ACT AEPB Inhale 2 puffs into the lungs every 4 (four) hours as needed. 05/23/17   Peaches Vanoverbeke B, FNP  azithromycin (ZITHROMAX Z-PAK) 250 MG tablet Take 2 tablets (500 mg) on  Day 1,  followed by 1 tablet (250 mg) once daily on Days 2 through 5. 11/29/14   Menshew, Charlesetta Ivory, PA-C  benzonatate (TESSALON PERLES) 100 MG capsule Take 1 capsule (100 mg total) by mouth 3 (three) times daily as needed for cough (Take 1-2 per dose). 11/29/14   Menshew, Charlesetta Ivory, PA-C  cyclobenzaprine (FLEXERIL) 5 MG tablet 1 tablet every 8 hours as needed for muscle spasms 11/07/16   Irean Hong, MD  guaiFENesin-codeine 100-10 MG/5ML syrup Take 10 mLs by mouth 3 (three) times daily as needed. 05/23/17   [place] Sloop, Rulon Eisenmenger B, FNP  HYDROcodone-acetaminophen (NORCO) 5-325 MG tablet Take 1 tablet by mouth every 6 (six) hours as needed  for moderate pain. 11/07/16   Irean Hong, MD  ibuprofen (ADVIL,MOTRIN) 800 MG tablet Take 1 tablet (800 mg total) by mouth every 8 (eight) hours as needed for moderate pain. 11/07/16   Irean Hong, MD  predniSONE (DELTASONE) 10 MG tablet Take 5 tablets (50 mg total) by mouth daily. 05/23/17   Chinita Pester, FNP    Allergies Patient has no known allergies.  History reviewed. No pertinent family history.  Social History Social History   Tobacco Use  . Smoking status: Never Smoker  . Smokeless tobacco: Never Used  Substance Use Topics  . Alcohol use: No    Comment: occasional  . Drug use: Never    Review of Systems Constitutional: Positive for fever/chills ENT: Positive sore throat. Cardiovascular: Denies chest pain. Respiratory: Negative shortness of breath. Positive for cough. Gastrointestinal: Negative nausea,  Posttussive vomiting.  Negative for diarrhea.  Musculoskeletal: Negative for body aches Skin: Negative for rash. Neurological: Negative for headaches ____________________________________________   PHYSICAL EXAM:  VITAL SIGNS: ED Triage Vitals  Enc Vitals Group     BP 05/23/17 1734 (!) 157/104     Pulse Rate 05/23/17 1734 73     Resp 05/23/17 1734 20     Temp 05/23/17 1734 98.1 F (36.7 C)     Temp Source 05/23/17 1734 Oral     SpO2 05/23/17 1734 99 %     Weight 05/23/17 1735 280 lb (127 kg)     Height 05/23/17 1735   (1.803 m)     Head Circumference --      Peak Flow --      Pain Score 05/23/17 1735 9     Pain Loc --      Pain Edu? --      Excl. in GC? --     Constitutional: Alert and oriented. Well appearing and in no acute distress. Eyes: Conjunctivae are normal. EOMI. Ears: Bilateral TM normal. Nose: No congestion noted; no rhinnorhea. Mouth/Throat: Mucous membranes are moist.  Oropharynx mildly erythematous. Tonsils flat without exudate. Neck: No stridor.  Lymphatic: No cervical lymphadenopathy. Cardiovascular: Normal rate, regular  rhythm. Good peripheral circulation. Respiratory: Normal respiratory effort.  No retractions. Diminished throughout. Gastrointestinal: Soft and nontender.  Musculoskeletal: FROM x 4 extremities.  Neurologic:  Normal speech and language.  Skin:  Skin is warm, dry and intact. No rash noted. Psychiatric: Mood and affect are normal. Speech and behavior are normal.  ____________________________________________   LABS (all labs ordered are listed, but only abnormal results are displayed)  Labs Reviewed - No data to display ____________________________________________  EKG  Not indicated. ____________________________________________  RADIOLOGY  Chest x-ray negative for acute cardiopulmonary abnormality per radiology. ____________________________________________   PROCEDURES  Procedure(s) performed: None  Critical Care performed: No ____________________________________________   INITIAL IMPRESSION / ASSESSMENT AND PLAN / ED COURSE  47 y.o. female who presents to the emergency department for treatment and evaluation of cough x 2 weeks with posttussive emesis.  While in the emergency department, she was given an albuterol treatment with some improvement.  Chest x-ray is negative for acute abnormality.  She will be treated with prednisone, albuterol, and guaifenesin with codeine cough syrup.  She was encouraged to schedule follow-up appointment with primary care provider for choice.  She was also encouraged to monitor her blood pressure and schedule an appointment to discuss it with her primary care provider. She was encouraged to return to the ER for symptoms that change or worsen if unable to schedule an appointment.  Medications  albuterol (PROVENTIL) (2.5 MG/3ML) 0.083% nebulizer solution 2.5 mg (2.5 mg Nebulization Given 05/23/17 1814)    ED Discharge Orders        Ordered    Albuterol Sulfate (PROAIR RESPICLICK) 108 (90 Base) MCG/ACT AEPB  Every 4 hours PRN     05/23/17 1920     guaiFENesin-codeine 100-10 MG/5ML syrup  3 times daily PRN     05/23/17 1920    predniSONE (DELTASONE) 10 MG tablet  Daily     05/23/17 1920       Pertinent labs & imaging results that were available during my care of the patient were reviewed by me and considered in my medical decision making (see chart for details).    If controlled substance prescribed during this visit, 12 month history viewed on the [facility] prior to issuing an initial prescription for Schedule II or III opiod. ____________________________________________   FINAL CLINICAL IMPRESSION(S) / ED DIAGNOSES  Final diagnoses:  Acute bronchitis, unspecified organism  Hypertension, unspecified type    Note:  This document was prepared using Dragon voice recognition software and may include unintentional dictation errors.     Chinita Pester, FNP 05/23/17 2042    Sharyn Creamer, MD 05/24/17 [phone]

## 2018-12-24 ENCOUNTER — Other Ambulatory Visit: Payer: Self-pay

## 2018-12-24 DIAGNOSIS — Z20822 Contact with and (suspected) exposure to covid-19: Secondary | ICD-10-CM

## 2018-12-25 LAB — NOVEL CORONAVIRUS, NAA: SARS-CoV-2, NAA: NOT DETECTED

## 2020-04-15 IMAGING — CR DG CHEST 2V
1 series · 2 of 2 positions shown · non-contrast
Comparison: Radiographs March 10, 2015.

CLINICAL DATA: Productive cough, chest pain.

EXAM:
CHEST - 2 VIEW

[Series 1: dg chest 2 view · 0.14mm/px · 2 of 2 slices shown]
[im 1/2]
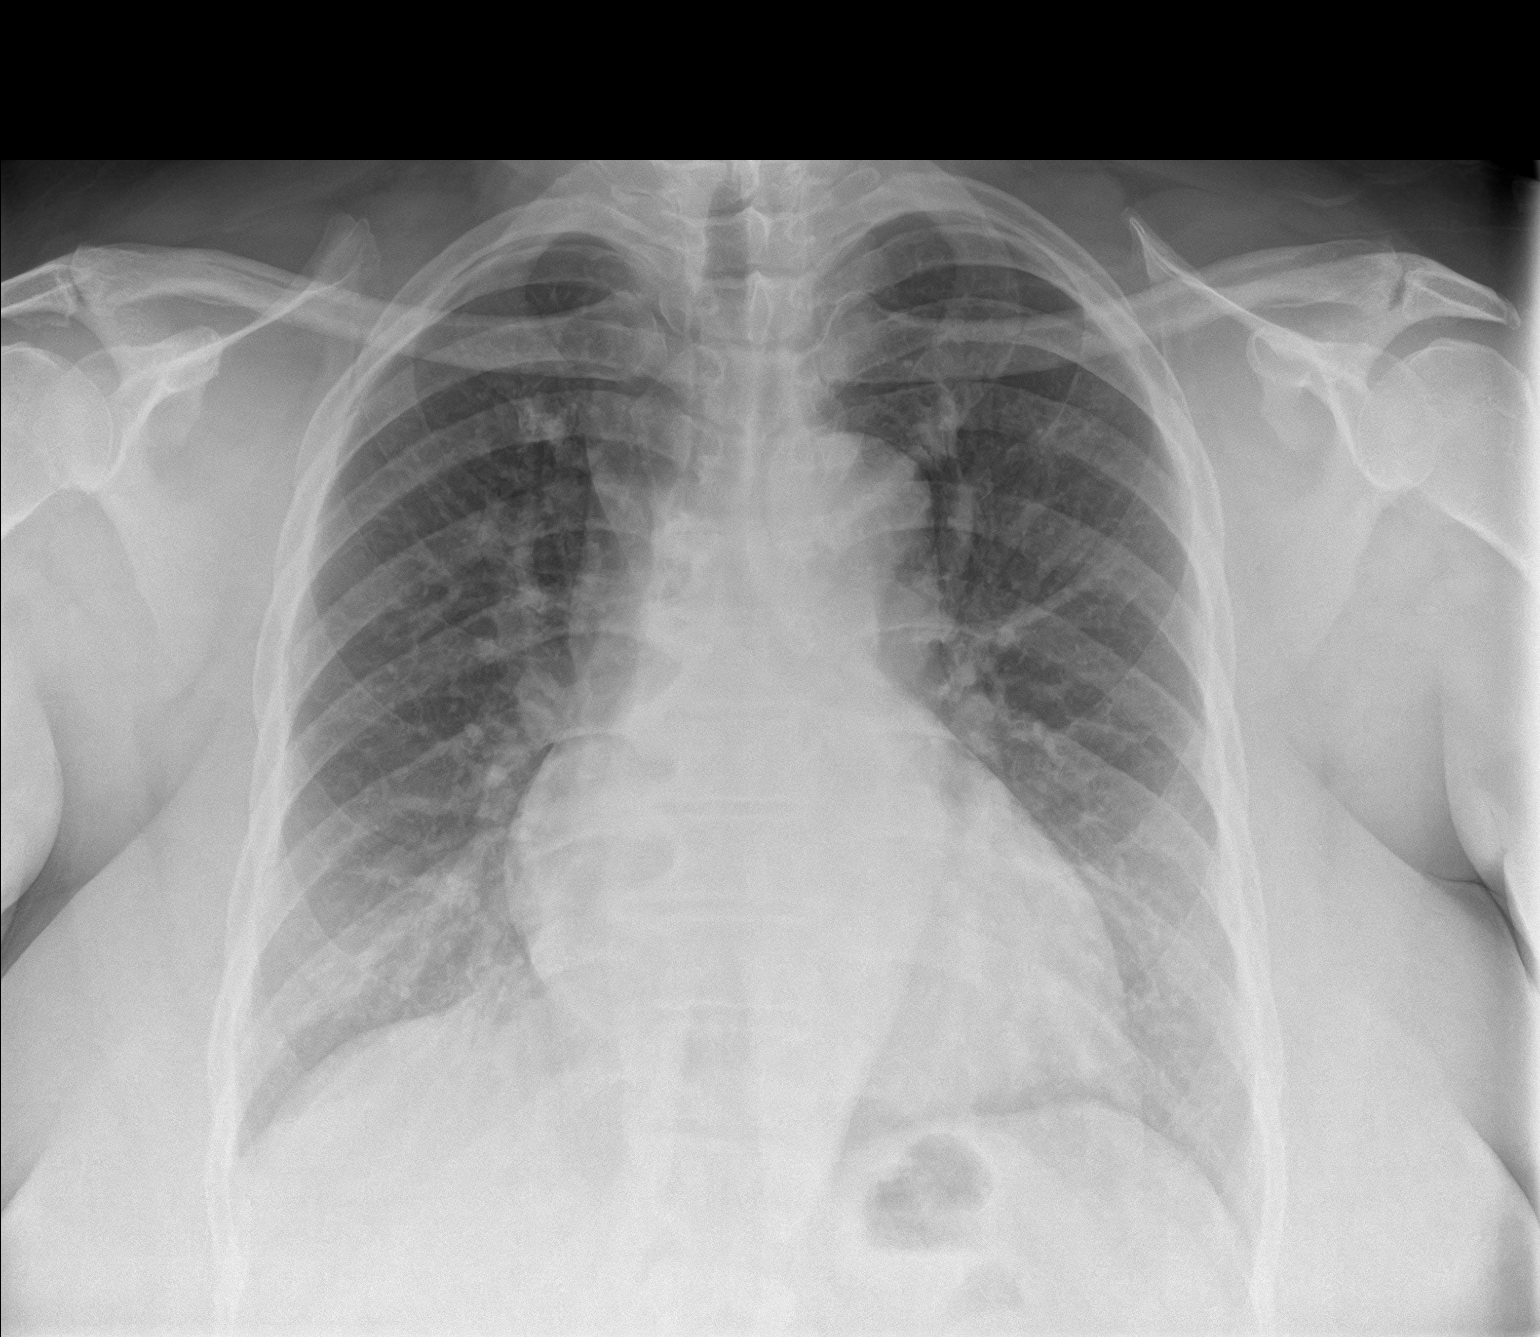
[im 2/2]
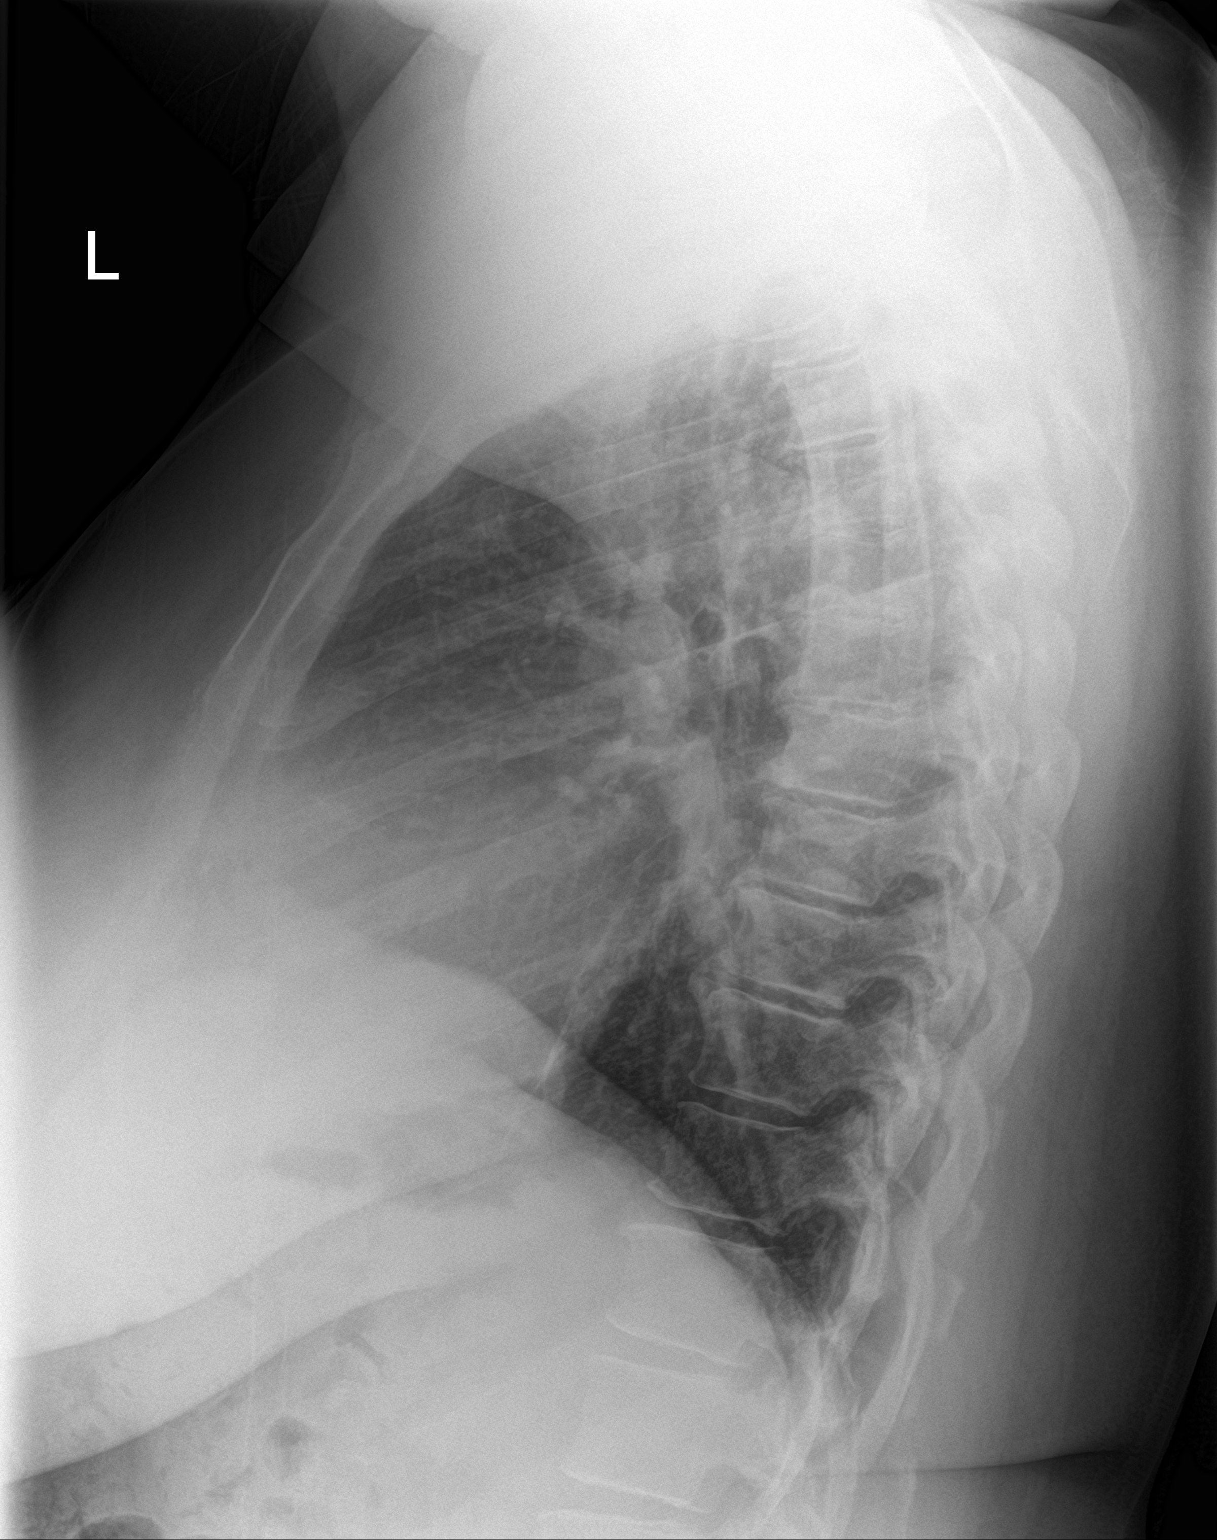

[2 of 2 positions shown; findings below may reference images not displayed]

FINDINGS: Stable cardiomegaly. No pneumothorax or pleural effusion is noted.
Both lungs are clear. The visualized skeletal structures are
unremarkable.
IMPRESSION: No active cardiopulmonary disease.

## 2023-01-14 ENCOUNTER — Other Ambulatory Visit: Payer: Self-pay

## 2023-01-14 ENCOUNTER — Emergency Department
Admission: EM | Admit: 2023-01-14 | Discharge: 2023-01-14 | Disposition: A | Discharge status: Home / Self Care | Payer: BC Managed Care – PPO | Attending: Emergency Medicine | Admitting: Emergency Medicine

## 2023-01-14 DIAGNOSIS — J020 Streptococcal pharyngitis: Secondary | ICD-10-CM | POA: Diagnosis not present

## 2023-01-14 DIAGNOSIS — I10 Essential (primary) hypertension: Secondary | ICD-10-CM | POA: Insufficient documentation

## 2023-01-14 DIAGNOSIS — Z20822 Contact with and (suspected) exposure to covid-19: Secondary | ICD-10-CM | POA: Diagnosis not present

## 2023-01-14 DIAGNOSIS — R509 Fever, unspecified: Secondary | ICD-10-CM | POA: Diagnosis present

## 2023-01-14 HISTORY — DX: Essential (primary) hypertension: I10

## 2023-01-14 LAB — GROUP A STREP BY PCR: Group A Strep by PCR: DETECTED — AB

## 2023-01-14 LAB — RESP PANEL BY RT-PCR (RSV, FLU A&B, COVID)  RVPGX2
Influenza A by PCR: NEGATIVE
Influenza B by PCR: NEGATIVE
Resp Syncytial Virus by PCR: NEGATIVE
SARS Coronavirus 2 by RT PCR: NEGATIVE

## 2023-01-14 LAB — SARS CORONAVIRUS 2 BY RT PCR: SARS Coronavirus 2 by RT PCR: NEGATIVE

## 2023-01-14 MED ORDER — AMOXICILLIN 500 MG PO CAPS: 500.0000 mg | ORAL_CAPSULE | Freq: Three times a day (TID) | ORAL | 0 refills | Status: AC

## 2023-01-14 MED ORDER — AMOXICILLIN 500 MG PO CAPS
500.0000 mg | ORAL_CAPSULE | Freq: Once | ORAL | Status: AC
  Administered 2023-01-14: 500 mg via ORAL
  Filled 2023-01-14: qty 1

## 2023-01-14 NOTE — ED Triage Notes (Signed)
Pt to ED for URI since Sunday, has tried nyquil, cough syrup, tylenol, BC's.  Symptoms include congestion, sore throat, cough, cold chills, and HA in mornings. Pt crying in triage.   States she may be dehydrated, mouth is dry. States sometimes she sees bloon tinged color to sputum. Took 2 BC's this AM.

## 2023-01-14 NOTE — ED Provider Notes (Cosign Needed)
[facility] Provider Note    None    (approximate)   History   Sore Throat, Headache, and Cough   HPI  Stacey Bentley is a 52 y.o. female with history of hypertension presents emergency department complaining of a sore throat, cough, chills and headache.  Symptoms for 3 days.  States she feels dehydrated.  No vomiting or diarrhea.      Physical Exam   Triage Vital Signs: ED Triage Vitals  Encounter Vitals Group     BP 01/14/23 1031 137/89     Systolic BP Percentile --      Diastolic BP Percentile --      Pulse Rate 01/14/23 1031 71     Resp 01/14/23 1031 16     Temp 01/14/23 1031 98.6 F (37 C)     Temp Source 01/14/23 1031 Oral     SpO2 01/14/23 1031 98 %     Weight 01/14/23 1030 269 lb (122 kg)     Height 01/14/23 1030 5\' 11"  (1.803 m)     Head Circumference --      Peak Flow --      Pain Score 01/14/23 1027 8     Pain Loc --      Pain Education --      Exclude from Growth Chart --     Most recent vital signs: Vitals:   01/14/23 1031  BP: 137/89  Pulse: 71  Resp: 16  Temp: 98.6 F (37 C)  SpO2: 98%     General: Awake, no distress.   CV:  Good peripheral perfusion. regular rate and  rhythm Resp:  Normal effort. Lungs cta Abd:  No distention.   Other:  ENT: TMs clear bilaterally, throat is red, mouth is moist, neck is supple, no lymphadenopathy   ED Results / Procedures / Treatments   Labs (all labs ordered are listed, but only abnormal results are displayed) Labs Reviewed  GROUP A STREP BY PCR - Abnormal; Notable for the following components:      Result Value   Group A Strep by PCR DETECTED (*)    All other components within normal limits  SARS CORONAVIRUS 2 BY RT PCR  RESP PANEL BY RT-PCR (RSV, FLU A&B, COVID)  [id number]     EKG     RADIOLOGY     PROCEDURES:   Procedures   MEDICATIONS ORDERED IN ED: Medications  amoxicillin (AMOXIL) capsule 500 mg (500 mg Oral Given 01/14/23 1129)      IMPRESSION / MDM / ASSESSMENT AND PLAN / ED COURSE  I reviewed the triage vital signs and the nursing notes.                              Differential diagnosis includes, but is not limited to, COVID, influenza, RSV, strep throat  Patient's presentation is most consistent with acute illness / injury with system symptoms.   Strep test positive  COVID test negative  Patient was inquiring about IV fluids that she feels dehydrated.  Explained to her that her vitals are normal, she does not appear to be dehydrated as her mouth is moist.  That we are on fluid restrictions and I cannot qualify her to give her IV fluids at this time.  She is able to tolerate p.o. liquids so we would want her to do oral rehydration.  Patient was given a prescription for amoxicillin.  Discharged stable condition.  FINAL CLINICAL IMPRESSION(S) / ED DIAGNOSES   Final diagnoses:  Acute streptococcal pharyngitis     Rx / DC Orders   ED Discharge Orders          Ordered    amoxicillin (AMOXIL) 500 MG capsule  3 times daily        01/14/23 1117             Note:  This document was prepared using Dragon voice recognition software and may include unintentional dictation errors.    Faythe Ghee, PA-C 01/14/23 1227

## 2023-03-24 ENCOUNTER — Ambulatory Visit: Attending: Otolaryngology

## 2023-03-24 DIAGNOSIS — G479 Sleep disorder, unspecified: Secondary | ICD-10-CM | POA: Diagnosis present

## 2023-03-24 DIAGNOSIS — G4733 Obstructive sleep apnea (adult) (pediatric): Secondary | ICD-10-CM | POA: Insufficient documentation

## 2023-04-30 ENCOUNTER — Ambulatory Visit: Attending: Otolaryngology

## 2023-04-30 DIAGNOSIS — R0683 Snoring: Secondary | ICD-10-CM | POA: Diagnosis present

## 2023-04-30 DIAGNOSIS — G4733 Obstructive sleep apnea (adult) (pediatric): Secondary | ICD-10-CM | POA: Insufficient documentation

## 2023-04-30 DIAGNOSIS — E669 Obesity, unspecified: Secondary | ICD-10-CM | POA: Diagnosis not present

## 2023-04-30 DIAGNOSIS — Z6837 Body mass index (BMI) 37.0-37.9, adult: Secondary | ICD-10-CM | POA: Insufficient documentation

## 2023-04-30 DIAGNOSIS — Z79899 Other long term (current) drug therapy: Secondary | ICD-10-CM | POA: Diagnosis not present
